# Patient Record
Sex: Male | Born: 1975 | Race: White | Hispanic: No | Marital: Single | Smoking: Never smoker
Health system: Southern US, Community
[De-identification: ages and names within clinical notes are randomized; demographics above are authoritative.]

---

## 2020-06-25 ENCOUNTER — Emergency Department (HOSPITAL_COMMUNITY)
Admission: EM | Admit: 2020-06-25 | Discharge: 2020-06-26 | Disposition: A | Discharge status: Home / Self Care | Payer: No Typology Code available for payment source | Attending: Emergency Medicine | Admitting: Emergency Medicine

## 2020-06-25 ENCOUNTER — Emergency Department (HOSPITAL_COMMUNITY): Payer: No Typology Code available for payment source

## 2020-06-25 ENCOUNTER — Encounter (HOSPITAL_COMMUNITY): Payer: Self-pay | Admitting: Emergency Medicine

## 2020-06-25 ENCOUNTER — Other Ambulatory Visit: Payer: Self-pay

## 2020-06-25 DIAGNOSIS — R222 Localized swelling, mass and lump, trunk: Secondary | ICD-10-CM | POA: Diagnosis not present

## 2020-06-25 DIAGNOSIS — Y92410 Unspecified street and highway as the place of occurrence of the external cause: Secondary | ICD-10-CM | POA: Diagnosis not present

## 2020-06-25 DIAGNOSIS — M545 Low back pain, unspecified: Secondary | ICD-10-CM
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
## 2020-06-26 MED ORDER — NAPROXEN 500 MG PO TABS: 500.0000 mg | ORAL_TABLET | Freq: Two times a day (BID) | ORAL | 0 refills | Status: AC

## 2020-06-26 MED ORDER — METHOCARBAMOL 500 MG PO TABS: 500.0000 mg | ORAL_TABLET | Freq: Every evening | ORAL | 0 refills | Status: AC | PRN

## 2020-06-26 MED ORDER — METHOCARBAMOL 500 MG PO TABS
500.0000 mg | ORAL_TABLET | Freq: Once | ORAL | Status: AC
  Administered 2020-06-26: 500 mg via ORAL
  Filled 2020-06-26: qty 1

## 2020-06-26 MED ORDER — IBUPROFEN 400 MG PO TABS
600.0000 mg | ORAL_TABLET | Freq: Once | ORAL | Status: AC
  Administered 2020-06-26: 600 mg via ORAL
  Filled 2020-06-26: qty 1

## 2020-06-26 NOTE — Discharge Instructions (Addendum)
Take naproxen 2 times a day with meals.  Do not take other anti-inflammatories at the same time (Advil, Motrin, naproxen, Aleve). You may supplement with Tylenol if you need further pain control. Use robaxin as needed for muscle stiffness or soreness.  Have caution, this may make you tired or groggy.  Do not drive or operate heavy machinery while taking this medicine. Use muscle creams/patches such as Salonpas, icy hot, BenGay, Biofreeze to help with pain as needed. Do gentle back stretches that are listed in the paperwork 2 times a day.  Use ice packs or heating pads if this helps control your pain. You will likely have continued muscle stiffness and soreness over the next couple days.  Follow-up with primary care or orthopedics in 1 week if your symptoms are not improving. Return to the emergency room if you develop vision changes, vomiting, slurred speech, numbness, loss of bowel or bladder control, or any new or worsening symptoms.   Regarding the mass on your back, follow-up with [facility] surgery as desired if you want this removed.  However if it is not causing pain or any issues, there is no need to get it removed.
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
